# Patient Record
Sex: Male | Born: 2000 | Race: White | Hispanic: No | Marital: Single | Smoking: Former smoker
Health system: Southern US, Community
[De-identification: ages and names within clinical notes are randomized; demographics above are authoritative.]

## PROBLEM LIST (undated history)

## (undated) DIAGNOSIS — F988 Other specified behavioral and emotional disorders with onset usually occurring in childhood and adolescence: Secondary | ICD-10-CM

## (undated) DIAGNOSIS — F909 Attention-deficit hyperactivity disorder, unspecified type: Secondary | ICD-10-CM

## (undated) HISTORY — PX: NO PAST SURGERIES: SHX2092

## (undated) HISTORY — PX: FRACTURE SURGERY: SHX138

---
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
## 2020-03-17 ENCOUNTER — Other Ambulatory Visit: Payer: Self-pay

## 2020-03-17 ENCOUNTER — Ambulatory Visit (HOSPITAL_COMMUNITY)
Admission: EM | Admit: 2020-03-17 | Discharge: 2020-03-17 | Disposition: A | Discharge status: Home / Self Care | Payer: Medicaid Other | Attending: Family Medicine | Admitting: Family Medicine

## 2020-03-17 ENCOUNTER — Encounter (HOSPITAL_COMMUNITY): Payer: Self-pay

## 2020-03-17 DIAGNOSIS — Z20822 Contact with and (suspected) exposure to covid-19: Secondary | ICD-10-CM | POA: Insufficient documentation

## 2020-03-17 DIAGNOSIS — J029 Acute pharyngitis, unspecified: Secondary | ICD-10-CM | POA: Insufficient documentation

## 2020-03-17 LAB — POCT RAPID STREP A, ED / UC: Streptococcus, Group A Screen (Direct): NEGATIVE

## 2020-03-17 LAB — SARS CORONAVIRUS 2 (TAT 6-24 HRS): SARS Coronavirus 2: NEGATIVE

## 2020-03-17 NOTE — Discharge Instructions (Signed)
Negative rapid strep. We have sent your test to be cultured to confirm this result.  Self isolate until covid results are back and negative.  Will notify you by phone of any positive findings. Your negative results will be sent through your MyChart.     Throat lozenges, gargles, chloraseptic spray, warm teas, popsicles etc to help with throat pain.   If symptoms worsen or do not improve in the next week to return to be seen or to follow up with your PCP.

## 2020-03-17 NOTE — ED Provider Notes (Signed)
[facility]    CSN: [id number] Arrival date & time: 03/17/20  1030      History   Chief Complaint Chief Complaint  Patient presents with  . Sore Throat    HPI Randall Roth. is a 19 y.o. male.   Randall Roth. presents with complaints of sore throat, chills, headache. Headache comes and goes. Started two days ago. No known fevers. Some congestion. No cough. No rash. No gi symptoms. Was around his cousin Randall Roth for lunch on 8/8, who later tested positive for covid-19. Denies any previous similar, although did have strep frequently as a child. Feels somewhat similar. Has gotten his covid vaccine series.    ROS per HPI, negative if not otherwise mentioned.      Past Medical History:  Diagnosis Date  . ADD (attention deficit disorder)     There are no problems to display for this patient.   Past Surgical History:  Procedure Laterality Date  . NO PAST SURGERIES         Home Medications    Prior to Admission medications   Medication Sig Start Date End Date Taking? Authorizing Provider  amphetamine-dextroamphetamine (ADDERALL) 20 MG tablet Take 20 mg by mouth daily.  07/01/19  [provider]    Family History Family History  Problem Relation Age of Onset  . Healthy Mother   . Other Father        unknown medical history    Social History Social History   Tobacco Use  . Smoking status: Never Smoker  . Smokeless tobacco: Never Used  Vaping Use  . Vaping Use: Some days  . Substances: Nicotine, Flavoring  Substance Use Topics  . Alcohol use: No  . Drug use: No     Allergies   Patient has no known allergies.   Review of Systems Review of Systems   Physical Exam Triage Vital Signs ED Triage Vitals [03/17/20 1157]  Enc Vitals Group     BP 118/68     Pulse Rate 86     Resp 16     Temp 98.9 F (37.2 C)     Temp Source Oral     SpO2 97 %     Weight 145 lb (65.8 kg)     Height 5\' 11"  (1.803 m)     Head Circumference       Peak Flow      Pain Score 0     Pain Loc      Pain Edu?      Excl. in GC?    No data found.  Updated Vital Signs BP 118/68   Pulse 86   Temp 98.9 F (37.2 C) (Oral)   Resp 16   Ht 5\' 11"  (1.803 m)   Wt 145 lb (65.8 kg)   SpO2 97%   BMI 20.22 kg/m    Physical Exam Constitutional:      Appearance: He is well-developed.  HENT:     Nose:     Comments: Mild exudate to left tonsil without significant edema     Mouth/Throat:     Tonsils: Tonsillar exudate present.  Cardiovascular:     Rate and Rhythm: Normal rate.  Pulmonary:     Effort: Pulmonary effort is normal.  Lymphadenopathy:     Cervical: No cervical adenopathy.  Skin:    General: Skin is warm and dry.  Neurological:     Mental Status: He is alert and oriented to person, place, and time.  UC Treatments / Results  Labs (all labs ordered are listed, but only abnormal results are displayed) Labs Reviewed  SARS CORONAVIRUS 2 (TAT 6-24 HRS)  CULTURE, GROUP A STREP [facility])  POCT RAPID STREP A, ED / UC    EKG   Radiology No results found.  Procedures Procedures (including critical care time)  Medications Ordered in UC Medications - No data to display  Initial Impression / Assessment and Plan / UC Course  I have reviewed the triage vital signs and the nursing notes.  Pertinent labs & imaging results that were available during my care of the patient were reviewed by me and considered in my medical decision making (see chart for details).     Non toxic. Benign physical exam.  Negative rapid strep. covid testing pending. Supportive cares recommended. Return precautions provided. Patient verbalized understanding and agreeable to plan.   Final Clinical Impressions(s) / UC Diagnoses   Final diagnoses:  Acute pharyngitis, unspecified etiology     Discharge Instructions     Negative rapid strep. We have sent your test to be cultured to confirm this result.  Self isolate until covid results are  back and negative.  Will notify you by phone of any positive findings. Your negative results will be sent through your MyChart.     Throat lozenges, gargles, chloraseptic spray, warm teas, popsicles etc to help with throat pain.   If symptoms worsen or do not improve in the next week to return to be seen or to follow up with your PCP.       ED Prescriptions    None     PDMP not reviewed this encounter.   Georgetta Haber, NP 03/17/20 1229

## 2020-03-17 NOTE — ED Triage Notes (Signed)
Pt c/o sore throat, HA, chills, dysphagiax2 days.

## 2020-03-19 LAB — CULTURE, GROUP A STREP (THRC)

## 2020-06-25 ENCOUNTER — Ambulatory Visit
Admission: EM | Admit: 2020-06-25 | Discharge: 2020-06-25 | Disposition: A | Discharge status: Home / Self Care | Payer: Medicaid Other | Attending: Emergency Medicine | Admitting: Emergency Medicine

## 2020-06-25 ENCOUNTER — Other Ambulatory Visit: Payer: Self-pay

## 2020-06-25 ENCOUNTER — Encounter: Payer: Self-pay | Admitting: Emergency Medicine

## 2020-06-25 DIAGNOSIS — Z20822 Contact with and (suspected) exposure to covid-19: Secondary | ICD-10-CM | POA: Diagnosis present

## 2020-06-25 DIAGNOSIS — K529 Noninfective gastroenteritis and colitis, unspecified: Secondary | ICD-10-CM | POA: Insufficient documentation

## 2020-06-25 LAB — RESP PANEL BY RT-PCR (FLU A&B, COVID) ARPGX2
Influenza A by PCR: NEGATIVE
Influenza B by PCR: NEGATIVE
SARS Coronavirus 2 by RT PCR: NEGATIVE

## 2020-06-25 MED ORDER — ONDANSETRON 8 MG PO TBDP: ORAL_TABLET | ORAL | 0 refills | Status: DC

## 2020-06-25 NOTE — Discharge Instructions (Addendum)
Push electrolyte containing fluids such as Pedialyte or Gatorade till your urine is clear.  Tylenol/ibuprofen combined 3-4 times a day as needed for headache.  Zofran 3 times a day as needed for nausea.  This may make you constipated.  May to go back to work tomorrow if Covid is negative.

## 2020-06-25 NOTE — ED Triage Notes (Addendum)
Patient states that he had vomiting and diarrhea on Wed.  Patient states that his symptoms have resolved but still has nausea.  Patient will need a work note

## 2020-06-25 NOTE — ED Provider Notes (Signed)
HPI  SUBJECTIVE:  Randall Roth. is a 19 y.o. male who presents with nausea, vomiting, and diarrhea starting 4 days ago after eating some questionable leftovers.  States symptoms started several hours later.  He reports abdominal pain before the vomiting and diarrhea, which resolves after vomiting or having diarrhea.  No fevers.  He states that the abdominal pain, vomiting and diarrhea is gone, he is tolerating p.o. now, but has persistent nausea.  He reports a headache but thinks that this is due to dehydration.  No body aches, sore throat, nasal congestion, loss of sense of smell or taste, cough, shortness of breath.  No change in urine output.  No known Covid exposure.  He got the second dose of the Covid vaccine in June.  He tried ibuprofen for his headache with improvement in symptoms.  Symptoms are worse when he tries to eat processed food.  Past medical history negative for diabetes, abdominal surgeries, HIV, immunocompromise.  States that he needs a note to return to work.  PMD: He plans to go to [facility].    Past Medical History:  Diagnosis Date  . ADD (attention deficit disorder)     Past Surgical History:  Procedure Laterality Date  . NO PAST SURGERIES      Family History  Problem Relation Age of Onset  . Healthy Mother   . Other Father        unknown medical history    Social History   Tobacco Use  . Smoking status: Never Smoker  . Smokeless tobacco: Never Used  Vaping Use  . Vaping Use: Some days  . Substances: Nicotine, Flavoring  Substance Use Topics  . Alcohol use: No  . Drug use: No    No current facility-administered medications for this encounter.  Current Outpatient Medications:  .  ondansetron (ZOFRAN ODT) 8 MG disintegrating tablet, 1/2- 1 tablet q 8 hr prn nausea, vomiting, Disp: 20 tablet, Rfl: 0  No Known Allergies   ROS  As noted in HPI.   Physical Exam  BP 124/80 (BP Location: Right Arm)   Pulse 75   Temp 98.1 F (36.7 C)  (Oral)   Resp 16   Ht 5\' 10"  (1.778 m)   Wt 65.8 kg   SpO2 100%   BMI 20.81 kg/m   Constitutional: Well developed, well nourished, no acute distress Eyes:  EOMI, conjunctiva normal bilaterally HENT: Normocephalic, atraumatic,mucus membranes moist Respiratory: Normal inspiratory effort clear bilaterally Cardiovascular: Normal rate regular rhythm no murmurs rubs or gallops.  Cap refill 2 seconds. GI: nondistended, soft, nontender.  Active bowel sounds.  No rebound, guarding. skin: No rash, skin intact Musculoskeletal: no deformities Neurologic: Alert & oriented x 3, no focal neuro deficits Psychiatric: Speech and behavior appropriate   ED Course   Medications - No data to display  Orders Placed This Encounter  Procedures  . Resp Panel by RT-PCR (Flu A&B, Covid) Nasopharyngeal Swab    Standing Status:   Standing    Number of Occurrences:   1    Order Specific Question:   Is this test for diagnosis or screening    Answer:   Diagnosis of ill patient    Order Specific Question:   Symptomatic for COVID-19 as defined by CDC    Answer:   Yes    Order Specific Question:   Date of Symptom Onset    Answer:   06/23/2020    Order Specific Question:   Hospitalized for COVID-19  Answer:   No    Order Specific Question:   Admitted to ICU for COVID-19    Answer:   No    Order Specific Question:   Previously tested for COVID-19    Answer:   No    Order Specific Question:   Resident in a congregate (group) care setting    Answer:   No    Order Specific Question:   Employed in healthcare setting    Answer:   No    Order Specific Question:   Has patient completed COVID vaccination(s) (2 doses of Pfizer/Moderna 1 dose of [employer])    Answer:   Yes  . Airborne precautions    Standing Status:   Standing    Number of Occurrences:   1    Results for orders placed or performed during the hospital encounter of 06/25/20 (from the past 24 hour(s))  Resp Panel by RT-PCR (Flu A&B,  Covid) Nasopharyngeal Swab     Status: None   Collection Time: 06/25/20  3:55 PM   Specimen: Nasopharyngeal Swab; Nasopharyngeal(NP) swabs in vial transport medium  Result Value Ref Range   SARS Coronavirus 2 by RT PCR NEGATIVE NEGATIVE   Influenza A by PCR NEGATIVE NEGATIVE   Influenza B by PCR NEGATIVE NEGATIVE   No results found.  ED Clinical Impression  1. Gastroenteritis   2. Encounter for laboratory testing for COVID-19 virus      ED Assessment/Plan  Patient with a gastroenteritis.  His abdomen is benign.  He appears slightly dehydrated, but his vitals are normal.  However will check Covid.  Will contact patient only if positive.  Will send home with Zofran.  Push electrolyte containing fluids.  Work note clearing him to go back to work tomorrow if [employer] is negative.  Flu, Covid negative.  Discussed labs, MDM, treatment plan, and plan for follow-up with patient.  patient agrees with plan.   Meds ordered this encounter  Medications  . ondansetron (ZOFRAN ODT) 8 MG disintegrating tablet    Sig: 1/2- 1 tablet q 8 hr prn nausea, vomiting    Dispense:  20 tablet    Refill:  0    *This clinic note was created using Scientist, clinical (histocompatibility and immunogenetics). Therefore, there may be occasional mistakes despite careful proofreading.   ?    Domenick Gong, MD 06/25/20 1756

## 2020-09-14 ENCOUNTER — Other Ambulatory Visit: Payer: Self-pay

## 2020-09-14 ENCOUNTER — Ambulatory Visit (INDEPENDENT_AMBULATORY_CARE_PROVIDER_SITE_OTHER): Payer: Medicaid Other

## 2020-09-14 ENCOUNTER — Ambulatory Visit
Admission: EM | Admit: 2020-09-14 | Discharge: 2020-09-14 | Disposition: A | Discharge status: Home / Self Care | Payer: Medicaid Other | Attending: Physician Assistant | Admitting: Physician Assistant

## 2020-09-14 DIAGNOSIS — S161XXA Strain of muscle, fascia and tendon at neck level, initial encounter: Secondary | ICD-10-CM

## 2020-09-14 DIAGNOSIS — M25511 Pain in right shoulder: Secondary | ICD-10-CM

## 2020-09-14 DIAGNOSIS — S39012A Strain of muscle, fascia and tendon of lower back, initial encounter: Secondary | ICD-10-CM

## 2020-09-14 DIAGNOSIS — M542 Cervicalgia: Secondary | ICD-10-CM

## 2020-09-14 DIAGNOSIS — M25562 Pain in left knee: Secondary | ICD-10-CM

## 2020-09-14 HISTORY — DX: Attention-deficit hyperactivity disorder, unspecified type: F90.9

## 2020-09-14 MED ORDER — CYCLOBENZAPRINE HCL 10 MG PO TABS: 10.0000 mg | ORAL_TABLET | Freq: Three times a day (TID) | ORAL | 0 refills | Status: AC | PRN

## 2020-09-14 MED ORDER — ACETAMINOPHEN 500 MG PO TABS
1000.0000 mg | ORAL_TABLET | Freq: Once | ORAL | Status: AC
  Administered 2020-09-14: 1000 mg via ORAL

## 2020-09-14 MED ORDER — NAPROXEN 500 MG PO TBEC: 500.0000 mg | DELAYED_RELEASE_TABLET | Freq: Two times a day (BID) | ORAL | 0 refills | Status: AC
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
## 2020-09-14 NOTE — Discharge Instructions (Addendum)
SPRAIN: Stressed avoiding painful activities . Reviewed RICE guidelines. Use medications as directed, including NSAIDs. If no NSAIDs have been prescribed for you today, you may take Aleve or Motrin over the counter. May use Tylenol in between doses of NSAIDs.  If no improvement in the next 1-2 weeks, f/u with PCP or return to our office for reexamination, and please feel free to call or return at any time for any questions or concerns you may have and we will be happy to help you!      BACK PAIN: Stressed avoiding painful activities . RICE (REST, ICE, COMPRESSION, ELEVATION) guidelines reviewed. May alternate ice and heat. Consider use of muscle rubs, Salonpas patches, etc. Use medications as directed including muscle relaxers if prescribed. Take anti-inflammatory medications as prescribed or OTC NSAIDs/Tylenol.  F/u with PCP in 7-10 days for reexamination, and please feel free to call or return to the urgent care at any time for any questions or concerns you may have and we will be happy to help you!   BACK PAIN RED FLAGS: If the back pain acutely worsens or there are any red flag symptoms such as numbness/tingling, leg weakness, saddle anesthesia, or loss of bowel/bladder control, go immediately to the ER. Follow up with [place] as scheduled or sooner if the pain does not begin to resolve or if it worsens before the follow up    NECK PAIN: Stressed avoiding painful activities. This can exacerbate your symptoms and make them worse.  May apply heat to the areas of pain for some relief. Use medications as directed. Be aware of which medications make you drowsy and do not drive or operate any kind of heavy machinery while using the medication (ie pain medications or muscle relaxers). F/U with PCP for reexamination or return sooner if condition worsens or does not begin to improve over the next few days.   NECK PAIN RED FLAGS: If symptoms get worse than they are right now, you should come back sooner for  re-evaluation. If you have increased numbness/ tingling or notice that the numbness/tingling is affecting the legs or saddle region, go to ER. If you ever lose continence go to ER.

## 2020-11-16 ENCOUNTER — Other Ambulatory Visit: Payer: Self-pay

## 2020-11-16 ENCOUNTER — Ambulatory Visit
Admission: EM | Admit: 2020-11-16 | Discharge: 2020-11-16 | Disposition: A | Discharge status: Home / Self Care | Payer: Medicaid Other | Attending: Emergency Medicine | Admitting: Emergency Medicine

## 2020-11-16 DIAGNOSIS — M542 Cervicalgia: Secondary | ICD-10-CM | POA: Diagnosis present

## 2020-11-16 DIAGNOSIS — B2790 Infectious mononucleosis, unspecified without complication: Secondary | ICD-10-CM | POA: Diagnosis present

## 2020-11-16 DIAGNOSIS — J029 Acute pharyngitis, unspecified: Secondary | ICD-10-CM | POA: Insufficient documentation

## 2020-11-16 DIAGNOSIS — Z20822 Contact with and (suspected) exposure to covid-19: Secondary | ICD-10-CM | POA: Diagnosis present

## 2020-11-16 LAB — MONONUCLEOSIS SCREEN: Mono Screen: POSITIVE — AB

## 2020-11-16 LAB — GROUP A STREP BY PCR: Group A Strep by PCR: NOT DETECTED

## 2020-11-16 MED ORDER — IBUPROFEN 600 MG PO TABS: 600.0000 mg | ORAL_TABLET | Freq: Four times a day (QID) | ORAL | 0 refills | Status: AC | PRN

## 2020-11-16 MED ORDER — DEXAMETHASONE SODIUM PHOSPHATE 10 MG/ML IJ SOLN
10.0000 mg | Freq: Once | INTRAMUSCULAR | Status: AC
  Administered 2020-11-16: 10 mg via INTRAMUSCULAR

## 2020-11-16 MED ORDER — TIZANIDINE HCL 4 MG PO TABS: 4.0000 mg | ORAL_TABLET | Freq: Three times a day (TID) | ORAL | 0 refills | Status: DC | PRN
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
## 2020-11-16 NOTE — Discharge Instructions (Addendum)
Your strep PCR was negative.  I am checking a mono.  I will call you only if it is positive.  Or you can get the results through MyChart.  If you do not hear from me by the end of the day, you can assume that it is negative.  If you have mono, you will need to abstain from contact sports or any cavities where you might get hit in the abdomen.  I am giving you some information on mono so that you can read more about it.   COVID test will be back in 6 to 24 hours   1000 milligram of Tylenol and 600 mg ibuprofen together 3-4 times a day as needed for pain.  Make sure you drink plenty of extra fluids.  Some people find salt water gargles and  Traditional Medicinal's "Throat Coat" tea helpful. Take 5 mL of liquid Benadryl and 5 mL of Maalox. Mix it together, and then hold it in your mouth for as long as you can and then swallow. You may do this 4 times a day.    Follow-up with [facility] if your neck continues to give you problems after taking the Tylenol ibuprofen and Zanaflex.  Go to www.goodrx.com to look up your medications. This will give you a list of where you can find your prescriptions at the most affordable prices. Or ask the pharmacist what the cash price is, or if they have any other discount programs available to help make your medication more affordable. This can be less expensive than what you would pay with insurance.
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
## 2020-11-19 ENCOUNTER — Other Ambulatory Visit: Payer: Self-pay

## 2020-11-19 ENCOUNTER — Emergency Department (HOSPITAL_COMMUNITY)
Admission: EM | Admit: 2020-11-19 | Discharge: 2020-11-19 | Disposition: A | Discharge status: Home / Self Care | Payer: Medicaid Other | Attending: Emergency Medicine | Admitting: Emergency Medicine

## 2020-11-19 DIAGNOSIS — J029 Acute pharyngitis, unspecified: Secondary | ICD-10-CM | POA: Diagnosis present

## 2020-11-19 DIAGNOSIS — R110 Nausea: Secondary | ICD-10-CM | POA: Diagnosis not present

## 2020-11-19 DIAGNOSIS — B2700 Gammaherpesviral mononucleosis without complication: Secondary | ICD-10-CM | POA: Diagnosis not present

## 2020-11-19 DIAGNOSIS — Z87891 Personal history of nicotine dependence: Secondary | ICD-10-CM | POA: Insufficient documentation

## 2020-11-19 DIAGNOSIS — R7401 Elevation of levels of liver transaminase levels: Secondary | ICD-10-CM

## 2020-11-19 LAB — CBC WITH DIFFERENTIAL/PLATELET
Abs Immature Granulocytes: 0.05 10*3/uL (ref 0.00–0.07)
Basophils Absolute: 0.1 10*3/uL (ref 0.0–0.1)
Basophils Relative: 1 %
Eosinophils Absolute: 0.2 10*3/uL (ref 0.0–0.5)
Eosinophils Relative: 1 %
HCT: 39.3 % (ref 39.0–52.0)
Hemoglobin: 12.9 g/dL — ABNORMAL LOW (ref 13.0–17.0)
Immature Granulocytes: 0 %
Lymphocytes Relative: 45 %
Lymphs Abs: 5.7 10*3/uL — ABNORMAL HIGH (ref 0.7–4.0)
MCH: 30.6 pg (ref 26.0–34.0)
MCHC: 32.8 g/dL (ref 30.0–36.0)
MCV: 93.3 fL (ref 80.0–100.0)
Monocytes Absolute: 1.2 10*3/uL — ABNORMAL HIGH (ref 0.1–1.0)
Monocytes Relative: 10 %
Neutro Abs: 5.4 10*3/uL (ref 1.7–7.7)
Neutrophils Relative %: 43 %
Platelets: 212 10*3/uL (ref 150–400)
RBC: 4.21 MIL/uL — ABNORMAL LOW (ref 4.22–5.81)
RDW: 13.2 % (ref 11.5–15.5)
WBC: 12.6 10*3/uL — ABNORMAL HIGH (ref 4.0–10.5)
nRBC: 0 % (ref 0.0–0.2)

## 2020-11-19 LAB — COMPREHENSIVE METABOLIC PANEL
ALT: 124 U/L — ABNORMAL HIGH (ref 0–44)
AST: 37 U/L (ref 15–41)
Albumin: 3.2 g/dL — ABNORMAL LOW (ref 3.5–5.0)
Alkaline Phosphatase: 62 U/L (ref 38–126)
Anion gap: 7 (ref 5–15)
BUN: 13 mg/dL (ref 6–20)
CO2: 25 mmol/L (ref 22–32)
Calcium: 8.5 mg/dL — ABNORMAL LOW (ref 8.9–10.3)
Chloride: 104 mmol/L (ref 98–111)
Creatinine, Ser: 0.8 mg/dL (ref 0.61–1.24)
GFR, Estimated: >60 mL/min (ref >60)
Glucose, Bld: 86 mg/dL (ref 70–99)
Potassium: 4.4 mmol/L (ref 3.5–5.1)
Sodium: 136 mmol/L (ref 135–145)
Total Bilirubin: 0.7 mg/dL (ref 0.3–1.2)
Total Protein: 5.9 g/dL — ABNORMAL LOW (ref 6.5–8.1)

## 2020-11-19 MED ORDER — ONDANSETRON HCL 4 MG PO TABS: 4.0000 mg | ORAL_TABLET | Freq: Four times a day (QID) | ORAL | 0 refills | Status: AC | PRN

## 2020-11-19 MED ORDER — METHYLPREDNISOLONE SODIUM SUCC 125 MG IJ SOLR
125.0000 mg | Freq: Once | INTRAMUSCULAR | Status: AC
  Administered 2020-11-19: 125 mg via INTRAVENOUS
  Filled 2020-11-19: qty 2

## 2020-11-19 MED ORDER — LACTATED RINGERS IV BOLUS
1000.0000 mL | Freq: Once | INTRAVENOUS | Status: AC
  Administered 2020-11-19: 1000 mL via INTRAVENOUS

## 2020-11-19 MED ORDER — PREDNISONE 50 MG PO TABS: 50.0000 mg | ORAL_TABLET | Freq: Every day | ORAL | 0 refills | Status: DC

## 2020-11-19 MED ORDER — ONDANSETRON HCL 4 MG/2ML IJ SOLN
4.0000 mg | Freq: Once | INTRAMUSCULAR | Status: AC
  Administered 2020-11-19: 4 mg via INTRAVENOUS
  Filled 2020-11-19: qty 2

## 2020-11-19 NOTE — ED Provider Notes (Signed)
[facility] EMERGENCY DEPARTMENT Provider Note   CSN: [id number] Arrival date & time: 11/19/20  0421   History Chief Complaint  Patient presents with  . Sore Throat    Randall Roth. is a 20 y.o. male.  The history is provided by the patient.  Sore Throat  He comes in complaining of ongoing sore throat and pain in his neck.  He started having a sore throat about 4 days ago and was seen at urgent care 2 days ago where he was diagnosed with mono.  He was given a dose of dexamethasone.  He felt better the next day, but over the last 24 hours, he has had increased swelling in his neck and pain in his neck.  There has been some nausea but no vomiting.  He denies fever or chills.  He denies weakness.  Past Medical History:  Diagnosis Date  . ADD (attention deficit disorder)   . ADHD     There are no problems to display for this patient.   Past Surgical History:  Procedure Laterality Date  . NO PAST SURGERIES         Family History  Problem Relation Age of Onset  . Healthy Mother   . Other Father        unknown medical history    Social History   Tobacco Use  . Smoking status: Former Games developer  . Smokeless tobacco: Never Used  Vaping Use  . Vaping Use: Some days  . Substances: Nicotine, Flavoring  Substance Use Topics  . Alcohol use: No    Comment: social  . Drug use: No    Home Medications Prior to Admission medications   Medication Sig Start Date End Date Taking? Authorizing Provider  amphetamine-dextroamphetamine (ADDERALL XR) 20 MG 24 hr capsule Take 1 capsule by mouth every morning. 10/18/20 11/17/20  [provider]  ibuprofen (ADVIL) 600 MG tablet Take 1 tablet (600 mg total) by mouth every 6 (six) hours as needed. 11/16/20   Domenick Gong, MD  tiZANidine (ZANAFLEX) 4 MG tablet Take 1 tablet (4 mg total) by mouth every 8 (eight) hours as needed for muscle spasms. 11/16/20   Domenick Gong, MD    Allergies    Patient has no  known allergies.  Review of Systems   Review of Systems  All other systems reviewed and are negative.   Physical Exam Updated Vital Signs BP (!) 131/93 (BP Location: Left Arm)   Pulse 65   Temp (!) 97.3 F (36.3 C) (Oral)   Resp 18   Ht 5\' 11"  (1.803 m)   Wt 65.8 kg   SpO2 100%   BMI 20.23 kg/m   Physical Exam Vitals and nursing note reviewed.   20 year old male, resting comfortably and in no acute distress. Vital signs are significant for borderline elevated blood pressure. Oxygen saturation is 100%, which is normal. Head is normocephalic and atraumatic. PERRLA, EOMI. Oropharynx is shows moderate tonsillar hypertrophy with faint exudate.  There is no pooling of secretions, phonation is normal. Neck is nontender and supple.  There is moderate bilateral anterior and posterior cervical adenopathy. Back is nontender and there is no CVA tenderness. Lungs are clear without rales, wheezes, or rhonchi. Chest is nontender. Heart has regular rate and rhythm without murmur. Abdomen is soft, flat, nontender without masses or hepatosplenomegaly and peristalsis is normoactive. Extremities have no cyanosis or edema, full range of motion is present. Skin is warm and dry without rash. Neurologic: Mental  status is normal, cranial nerves are intact, there are no motor or sensory deficits.  ED Results / Procedures / Treatments   Labs (all labs ordered are listed, but only abnormal results are displayed) Labs Reviewed  COMPREHENSIVE METABOLIC PANEL - Abnormal; Notable for the following components:      Result Value   Calcium 8.5 (*)    Total Protein 5.9 (*)    Albumin 3.2 (*)    ALT 124 (*)    All other components within normal limits  CBC WITH DIFFERENTIAL/PLATELET - Abnormal; Notable for the following components:   WBC 12.6 (*)    RBC 4.21 (*)    Hemoglobin 12.9 (*)    All other components within normal limits   Procedures Procedures   Medications Ordered in ED Medications   lactated ringers bolus 1,000 mL (has no administration in time range)  ondansetron (ZOFRAN) injection 4 mg (has no administration in time range)  methylPREDNISolone sodium succinate (SOLU-MEDROL) 125 mg/2 mL injection 125 mg (has no administration in time range)    ED Course  I have reviewed the triage vital signs and the nursing notes.  Pertinent lab results that were available during my care of the patient were reviewed by me and considered in my medical decision making (see chart for details).  MDM Rules/Calculators/A&P Infectious mononucleosis.  Old records are reviewed confirming urgent care visit 2 days ago with positive monoscreen and treatment with single dose dexamethasone.  He will be given IV fluids, methylprednisolone, ondansetron.  Will check screening labs as well.  Labs show mild elevation of ALT, borderline anemia.  He feels significantly improved after above-noted treatment.  He is discharged with prescriptions for prednisone, ondansetron.  Final Clinical Impression(s) / ED Diagnoses Final diagnoses:  Gammaherpesviral mononucleosis without complication  Elevated ALT measurement  Nausea    Rx / DC Orders ED Discharge Orders         Ordered    ondansetron (ZOFRAN) 4 MG tablet  Every 6 hours PRN        11/19/20 0723    predniSONE (DELTASONE) 50 MG tablet  Daily        11/19/20 0724           Dione Booze, MD 11/19/20 [phone]

## 2020-11-19 NOTE — ED Triage Notes (Signed)
Pt presents to the ED with sore throat no fevers since Sunday. Pt states he tested positive for Mono and given a one time dose steroid and Ibuprofen, but patient states he is not feeling any better. Patent airway. Voice is hoarse.

## 2020-11-22 ENCOUNTER — Emergency Department (HOSPITAL_COMMUNITY)
Admission: EM | Admit: 2020-11-22 | Discharge: 2020-11-22 | Disposition: A | Discharge status: Home / Self Care | Payer: Medicaid Other | Attending: Emergency Medicine | Admitting: Emergency Medicine

## 2020-11-22 ENCOUNTER — Encounter (HOSPITAL_COMMUNITY): Payer: Self-pay

## 2020-11-22 ENCOUNTER — Other Ambulatory Visit: Payer: Self-pay

## 2020-11-22 DIAGNOSIS — R042 Hemoptysis: Secondary | ICD-10-CM | POA: Diagnosis present

## 2020-11-22 DIAGNOSIS — Z87891 Personal history of nicotine dependence: Secondary | ICD-10-CM | POA: Insufficient documentation

## 2020-11-22 LAB — COMPREHENSIVE METABOLIC PANEL
ALT: 160 U/L — ABNORMAL HIGH (ref 0–44)
AST: 42 U/L — ABNORMAL HIGH (ref 15–41)
Albumin: 3 g/dL — ABNORMAL LOW (ref 3.5–5.0)
Alkaline Phosphatase: 53 U/L (ref 38–126)
Anion gap: 4 — ABNORMAL LOW (ref 5–15)
BUN: 12 mg/dL (ref 6–20)
CO2: 30 mmol/L (ref 22–32)
Calcium: 8.6 mg/dL — ABNORMAL LOW (ref 8.9–10.3)
Chloride: 106 mmol/L (ref 98–111)
Creatinine, Ser: 0.85 mg/dL (ref 0.61–1.24)
GFR, Estimated: >60 mL/min (ref >60)
Glucose, Bld: 84 mg/dL (ref 70–99)
Potassium: 4.1 mmol/L (ref 3.5–5.1)
Sodium: 140 mmol/L (ref 135–145)
Total Bilirubin: 0.8 mg/dL (ref 0.3–1.2)
Total Protein: 5.8 g/dL — ABNORMAL LOW (ref 6.5–8.1)

## 2020-11-22 LAB — CBC WITH DIFFERENTIAL/PLATELET
Abs Immature Granulocytes: 0.06 10*3/uL (ref 0.00–0.07)
Basophils Absolute: 0.1 10*3/uL (ref 0.0–0.1)
Basophils Relative: 1 %
Eosinophils Absolute: 0.1 10*3/uL (ref 0.0–0.5)
Eosinophils Relative: 1 %
HCT: 40.9 % (ref 39.0–52.0)
Hemoglobin: 13.1 g/dL (ref 13.0–17.0)
Immature Granulocytes: 1 %
Lymphocytes Relative: 40 %
Lymphs Abs: 4.4 10*3/uL — ABNORMAL HIGH (ref 0.7–4.0)
MCH: 30.7 pg (ref 26.0–34.0)
MCHC: 32 g/dL (ref 30.0–36.0)
MCV: 95.8 fL (ref 80.0–100.0)
Monocytes Absolute: 1.1 10*3/uL — ABNORMAL HIGH (ref 0.1–1.0)
Monocytes Relative: 10 %
Neutro Abs: 5.2 10*3/uL (ref 1.7–7.7)
Neutrophils Relative %: 47 %
Platelets: 249 10*3/uL (ref 150–400)
RBC: 4.27 MIL/uL (ref 4.22–5.81)
RDW: 13.1 % (ref 11.5–15.5)
WBC: 10.9 10*3/uL — ABNORMAL HIGH (ref 4.0–10.5)
nRBC: 0 % (ref 0.0–0.2)

## 2020-11-22 LAB — PROTIME-INR
INR: 1 (ref 0.8–1.2)
Prothrombin Time: 12.9 seconds (ref 11.4–15.2)

## 2020-11-22 LAB — APTT: aPTT: 29 seconds (ref 24–36)

## 2020-11-22 NOTE — ED Notes (Signed)
Patient Alert and oriented to baseline. Stable and ambulatory to baseline. Patient verbalized understanding of the discharge instructions.  Patient belongings were taken by the patient.   

## 2020-11-22 NOTE — Discharge Instructions (Addendum)
Lab work and exam look reassuring.  I suspect the blood is coming from your right tonsil.  Please abstain from coughing, and eat a soft/liquid diet to help decrease inflammation around your tonsils.  I would like you to follow-up with your primary care doctor and/or ENT this week for reevaluation.  Please come back to emergency department if you have bleeding that is uncontrolled from your throat, you develop chest pain, shortness of breath, feeling lightheaded, fatigue, become pale as these symptoms concerning for possible anemia, also come back if you have worsening swelling of your neck, tonsils, tongue or difficulty swallowing own saliva.

## 2020-11-22 NOTE — ED Triage Notes (Signed)
Pt reports he was dx with mono on 11/19/20. Pt reports he is here today due to spitting up blood since 4am. Pt reports he think it is related to his sinuses bc he feels the blood is not coming from his throat. Pt reports he feels better but was concern for the blood.

## 2020-11-22 NOTE — ED Provider Notes (Signed)
[facility] EMERGENCY DEPARTMENT Provider Note   CSN: [id number] Arrival date & time: 11/22/20  1287     History Chief Complaint  Patient presents with  . Hematemesis    Randall Roth. is a 20 y.o. male.  HPI   Patient with significant medical history of ADHD presents with chief complaint of coughing up blood.  He states that he was recently diagnosed with mono has received steroids and felt like he is getting better.  But this morning he started to cough up some mucus and noted blood was in it.  He states that he only sees blood when he coughs, he is unsure if this is coming from his nose or his tonsils.  He has no bleeding disorder, is not on anticoagulant, he denies abnormal bruising or nosebleeds.  He denies alcohol dependency, denies any right upper quadrant pain.  Patient states he is here today because he is concerned of the blood in his throat.  He has no associated chest pain, shortness of breath, feeling fatigued, lightheadedness or dizziness.  Patient denies  alleviating factors.  Patient denies headaches, fevers, chills, shortness of breath, chest pain, abdominal pain, nausea, vomit, diarrhea, worsening pedal edema.  Past Medical History:  Diagnosis Date  . ADD (attention deficit disorder)   . ADHD     There are no problems to display for this patient.   Past Surgical History:  Procedure Laterality Date  . NO PAST SURGERIES         Family History  Problem Relation Age of Onset  . Healthy Mother   . Other Father        unknown medical history    Social History   Tobacco Use  . Smoking status: Former Games developer  . Smokeless tobacco: Never Used  Vaping Use  . Vaping Use: Some days  . Substances: Nicotine, Flavoring  Substance Use Topics  . Alcohol use: No    Comment: social  . Drug use: No    Home Medications Prior to Admission medications   Medication Sig Start Date End Date Taking? Authorizing Provider  amphetamine-dextroamphetamine  (ADDERALL XR) 20 MG 24 hr capsule Take 1 capsule by mouth every morning. 10/18/20 11/17/20  [provider]  ibuprofen (ADVIL) 600 MG tablet Take 1 tablet (600 mg total) by mouth every 6 (six) hours as needed. 11/16/20   Domenick Gong, MD  ondansetron (ZOFRAN) 4 MG tablet Take 1 tablet (4 mg total) by mouth every 6 (six) hours as needed for nausea. 11/19/20   Dione Booze, MD  predniSONE (DELTASONE) 50 MG tablet Take 1 tablet (50 mg total) by mouth daily. 11/19/20   Dione Booze, MD  tiZANidine (ZANAFLEX) 4 MG tablet Take 1 tablet (4 mg total) by mouth every 8 (eight) hours as needed for muscle spasms. 11/16/20   Domenick Gong, MD    Allergies    Patient has no known allergies.  Review of Systems   Review of Systems  Constitutional: Negative for chills and fever.  HENT: Negative for congestion, ear pain and sore throat.        Coughing up blood  Eyes: Negative for visual disturbance.  Respiratory: Negative for shortness of breath.   Cardiovascular: Negative for chest pain.  Gastrointestinal: Negative for abdominal pain, diarrhea, nausea and vomiting.  Genitourinary: Negative for enuresis.  Musculoskeletal: Negative for back pain.  Skin: Negative for rash.  Neurological: Negative for headaches.  Hematological: Does not bruise/bleed easily.    Physical Exam Updated Vital  Signs BP 129/83   Pulse 68   Temp 97.7 F (36.5 C)   Resp 15   SpO2 99%   Physical Exam Vitals and nursing note reviewed.  Constitutional:      General: He is not in acute distress.    Appearance: He is not ill-appearing.  HENT:     Head: Normocephalic and atraumatic.     Nose: No congestion.     Mouth/Throat:     Mouth: Mucous membranes are moist.     Pharynx: Oropharynx is clear. Posterior oropharyngeal erythema present. No oropharyngeal exudate.     Comments: Patient's oropharynx was visualized tongue and uvula are both midline, patient has noted erythema tonsils on the right side, there is  noted bleeding from the area, patient is controlling oral secretions, there is no noted swelling underneath the tongue.  Negative torticollis or trismus. Eyes:     Conjunctiva/sclera: Conjunctivae normal.  Cardiovascular:     Rate and Rhythm: Normal rate and regular rhythm.     Pulses: Normal pulses.     Heart sounds: No murmur heard. No friction rub. No gallop.   Pulmonary:     Effort: No respiratory distress.     Breath sounds: No wheezing, rhonchi or rales.  Abdominal:     Palpations: Abdomen is soft.     Tenderness: There is no abdominal tenderness.  Musculoskeletal:     Right lower leg: No edema.     Left lower leg: No edema.  Skin:    General: Skin is warm and dry.  Neurological:     Mental Status: He is alert.  Psychiatric:        Mood and Affect: Mood normal.     ED Results / Procedures / Treatments   Labs (all labs ordered are listed, but only abnormal results are displayed) Labs Reviewed  COMPREHENSIVE METABOLIC PANEL - Abnormal; Notable for the following components:      Result Value   Calcium 8.6 (*)    Total Protein 5.8 (*)    Albumin 3.0 (*)    AST 42 (*)    ALT 160 (*)    Anion gap 4 (*)    All other components within normal limits  CBC WITH DIFFERENTIAL/PLATELET - Abnormal; Notable for the following components:   WBC 10.9 (*)    All other components within normal limits  PROTIME-INR  APTT    EKG None  Radiology No results found.  Procedures Procedures   Medications Ordered in ED Medications - No data to display  ED Course  I have reviewed the triage vital signs and the nursing notes.  Pertinent labs & imaging results that were available during my care of the patient were reviewed by me and considered in my medical decision making (see chart for details).    MDM Rules/Calculators/A&P                         Initial impression-patient presents with concerns of coughing up blood.  He is alert, does not appear in acute distress, vital  signs reassuring.  Concern for possible coagulopathy secondary due to mono, will obtain basic lab works and reassess.  Work-up-CBC shows Mutaz of 10.9, no signs of anemia.  Prothrombin time 12.9, INR 1, a PTT 29, CMP shows slightly elevated AST and ALT, AST 42, ALT 160   Rule out- I have low suspicion for peritonsillar abscess, retropharyngeal abscess, or Ludwig angina as oropharynx was visualized tongue and uvula  were both midline, no torticollis, patient controlling oral secretions.  Low suspicion for coagulopathy as prothrombin time is 12.9, INR 1, APTT 29. low Suspicion for anemia as hemoglobin is 13..1, patient denies anemia symptoms. low Suspicion for cirrhosis as liver enzymes at baseline from 4 days prior, no right upper quadrant pain..  Plan-  1.  Coughing up blood suspect is secondary due to mono [place] patient dislodged a clot on his tonsils causing bleeding.  We will have him follow-up with ENT and and/or primary care doctor in 1 week's time for reevaluation.  Vital signs have remained stable, no indication for hospital admission.  Patient discussed with attending and they agreed with assessment and plan.  Patient given at home care as well strict return precautions.  Patient verbalized that they understood agreed to said plan.   Final Clinical Impression(s) / ED Diagnoses Final diagnoses:  Hemoptysis    Rx / DC Orders ED Discharge Orders    None       Carroll Sage, PA-C 11/22/20 4360    Benjiman Core, MD 11/22/20 [phone]

## 2021-01-08 ENCOUNTER — Ambulatory Visit
Admission: EM | Admit: 2021-01-08 | Discharge: 2021-01-08 | Disposition: A | Discharge status: Home / Self Care | Payer: Medicaid Other

## 2021-01-08 ENCOUNTER — Other Ambulatory Visit: Payer: Self-pay

## 2021-01-08 ENCOUNTER — Encounter: Payer: Self-pay | Admitting: Emergency Medicine

## 2021-01-08 DIAGNOSIS — S72412S Displaced unspecified condyle fracture of lower end of left femur, sequela: Secondary | ICD-10-CM

## 2021-01-08 MED ORDER — OXYCODONE HCL 5 MG PO TABS: 5.0000 mg | ORAL_TABLET | ORAL | 0 refills | Status: AC | PRN

## 2021-01-08 NOTE — Discharge Instructions (Addendum)
Call your orthopedic doctor Monday morning early so they can give you a refill if they want you to continue the Oxycodone until your next appointment with them Try stretching out the pain medication to every 6 hours, and you may take 1000 mg of Tylenol between doses.
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
## 2022-09-14 ENCOUNTER — Ambulatory Visit
Admission: EM | Admit: 2022-09-14 | Discharge: 2022-09-14 | Disposition: A | Discharge status: Home / Self Care | Payer: Medicaid Other | Attending: Emergency Medicine | Admitting: Emergency Medicine

## 2022-09-14 ENCOUNTER — Other Ambulatory Visit: Payer: Self-pay

## 2022-09-14 DIAGNOSIS — J029 Acute pharyngitis, unspecified: Secondary | ICD-10-CM | POA: Insufficient documentation

## 2022-09-14 LAB — RAPID INFLUENZA A&B ANTIGENS
Influenza A (ARMC): NEGATIVE
Influenza B (ARMC): NEGATIVE

## 2022-09-14 LAB — GROUP A STREP BY PCR: Group A Strep by PCR: NOT DETECTED

## 2022-09-14 NOTE — ED Provider Notes (Signed)
[facility]    CSN: [id number] Arrival date & time: 09/14/22  1035      History   Chief Complaint Chief Complaint  Patient presents with   Sore scratchy throat    HPI Randall Roth. is a 22 y.o. male.   HPI  22 year old male here for evaluation of sore throat.  The patient reports that his symptoms began 2 days ago and they are also associated with fatigue, nausea, nasal congestion, and a nonproductive cough.  He denies fever, ear pain, shortness of breath or wheezing, abdominal pain, or swollen glands.  He denies any known sick contacts.  Past Medical History:  Diagnosis Date   ADD (attention deficit disorder)    ADHD     There are no problems to display for this patient.   Past Surgical History:  Procedure Laterality Date   FRACTURE SURGERY     NO PAST SURGERIES         Home Medications    Prior to Admission medications   Medication Sig Start Date End Date Taking? Authorizing Provider  amphetamine-dextroamphetamine (ADDERALL XR) 20 MG 24 hr capsule Take 1 capsule by mouth every morning. 10/18/20 01/08/21  [provider]  CLEARLAX 17 GM/SCOOP powder Take 17 g by mouth daily. 01/05/21   [provider]  enoxaparin (LOVENOX) 40 MG/0.4ML injection Inject into the skin. 01/04/21 02/03/21  [provider]  gabapentin (NEURONTIN) 300 MG capsule Take 1 capsule by mouth 3 (three) times daily. 01/05/21   [provider]  ibuprofen (ADVIL) 600 MG tablet Take 1 tablet (600 mg total) by mouth every 6 (six) hours as needed. 11/16/20   Melynda Ripple, MD  methocarbamol (ROBAXIN) 500 MG tablet Take 1 tablet by mouth 3 (three) times daily. 01/05/21   [provider]  ondansetron (ZOFRAN) 4 MG tablet Take 1 tablet (4 mg total) by mouth every 6 (six) hours as needed for nausea. [id number]   Delora Fuel, MD  oxyCODONE (ROXICODONE) 5 MG immediate release tablet Take 1 tablet (5 mg total) by mouth every 4 (four) hours as needed for  severe pain. 01/09/21   Rodriguez-Southworth, Sunday Spillers, PA-C  Vitamin D, Ergocalciferol, (DRISDOL) 1.25 MG (50000 UNIT) CAPS capsule Take 1 capsule by mouth once a week. 01/05/21   [provider]    Family History Family History  Problem Relation Age of Onset   Healthy Mother    Other Father        unknown medical history    Social History Social History   Tobacco Use   Smoking status: Former   Smokeless tobacco: Never  Scientific laboratory technician Use: Some days   Substances: Nicotine, Flavoring  Substance Use Topics   Alcohol use: No    Comment: social   Drug use: No     Allergies   Patient has no known allergies.   Review of Systems Review of Systems  Constitutional:  Positive for fatigue. Negative for fever.  HENT:  Positive for congestion and sore throat. Negative for ear pain and rhinorrhea.   Respiratory:  Positive for cough. Negative for shortness of breath and wheezing.   Gastrointestinal:  Negative for abdominal pain.  Hematological:  Negative for adenopathy.     Physical Exam Triage Vital Signs ED Triage Vitals  Enc Vitals Group     BP 09/14/22 1104 136/80     Pulse Rate 09/14/22 1104 99     Resp --      Temp 09/14/22 1104 98.6  F (37 C)     Temp src --      SpO2 --      Weight --      Height --      Head Circumference --      Peak Flow --      Pain Score 09/14/22 1105 0     Pain Loc --      Pain Edu? --      Excl. in [place]? --    No data found.  Updated Vital Signs BP 136/80   Pulse 99   Temp 98.6 F (37 C)   Visual Acuity Right Eye Distance:   Left Eye Distance:   Bilateral Distance:    Right Eye Near:   Left Eye Near:    Bilateral Near:     Physical Exam Vitals and nursing note reviewed.  Constitutional:      Appearance: Normal appearance. He is not ill-appearing.  HENT:     Head: Normocephalic and atraumatic.     Right Ear: Tympanic membrane, ear canal and external ear normal. There is no impacted cerumen.     Left Ear:  Tympanic membrane, ear canal and external ear normal. There is no impacted cerumen.     Nose: Congestion and rhinorrhea present.     Comments: Nasal mucosa is erythematous and edematous with scant clear discharge in both nares.    Mouth/Throat:     Mouth: Mucous membranes are moist.     Pharynx: Oropharynx is clear. Posterior oropharyngeal erythema present. No oropharyngeal exudate.     Comments: Tonsillar pillars and soft palate are erythematous with 2+ edema to the tonsillar pillars.  No appreciable exudate.  Posterior oropharynx does have erythema with clear postnasal drip. Cardiovascular:     Rate and Rhythm: Normal rate and regular rhythm.     Pulses: Normal pulses.     Heart sounds: Normal heart sounds. No murmur heard.    No friction rub. No gallop.  Pulmonary:     Effort: Pulmonary effort is normal.     Breath sounds: Normal breath sounds. No wheezing, rhonchi or rales.  Musculoskeletal:     Cervical back: Normal range of motion and neck supple.  Lymphadenopathy:     Cervical: No cervical adenopathy.  Skin:    General: Skin is warm and dry.     Capillary Refill: Capillary refill takes less than 2 seconds.     Findings: No erythema or rash.  Neurological:     General: No focal deficit present.     Mental Status: He is alert and oriented to person, place, and time.  Psychiatric:        Mood and Affect: Mood normal.        Behavior: Behavior normal.        Thought Content: Thought content normal.        Judgment: Judgment normal.      UC Treatments / Results  Labs (all labs ordered are listed, but only abnormal results are displayed) Labs Reviewed  RAPID INFLUENZA A&B ANTIGENS  GROUP A STREP BY PCR    EKG   Radiology No results found.  Procedures Procedures (including critical care time)  Medications Ordered in UC Medications - No data to display  Initial Impression / Assessment and Plan / UC Course  I have reviewed the triage vital signs and the nursing  notes.  Pertinent labs & imaging results that were available during my care of the patient were reviewed by me and  considered in my medical decision making (see chart for details).   Patient is a pleasant, nontoxic-appearing 22 year old male here for evaluation of nasal congestion, nonproductive cough, and sore throat with associated nausea and fatigue that began 2 days ago.  No measured fever at home.  The patient denies any acute distress but he does have inflamed nasal mucosa as well as erythematous edematous tonsillar pillars.  I will order a strep PCR and flu antigen test as he has symptoms of an upper respiratory infection on exam.  Influenza antigen test is negative for A and B.  PCR is pending.  I will discharge patient home with diagnosis of pharyngitis and have him use over-the-counter Chloraseptic and Sucrets lozenges along with Tylenol, ibuprofen, salt water gargles to help soothe his pain.  If he does positive for strep I will start him on antibiotics.   Final Clinical Impressions(s) / UC Diagnoses   Final diagnoses:  Acute pharyngitis, unspecified etiology     Discharge Instructions      Your strep test is currently pending and we should have an answer later this afternoon.  If your test is positive then we will call you.  If your test is negative it will appear in your MyChart.  If you do not hear from [place] your sore throat is most likely viral in nature.  Gargle with warm salt water 2-3 times a day to soothe your throat, aid in pain relief, and aid in healing.  Take over-the-counter Tylenol and/or ibuprofen according to the package instructions as needed for pain.  You can also use Chloraseptic or Sucrets lozenges, 1 lozenge every 2 hours as needed for throat pain.  If you develop any new or worsening symptoms return for reevaluation.      ED Prescriptions   None    PDMP not reviewed this encounter.   Margarette Canada, NP 09/14/22 1220

## 2022-09-14 NOTE — ED Triage Notes (Signed)
Pt c/o weakness and nausea since Tuesday

## 2022-09-14 NOTE — Discharge Instructions (Addendum)
Your strep test is currently pending and we should have an answer later this afternoon.  If your test is positive then we will call you.  If your test is negative it will appear in your MyChart.  If you do not hear from [place] your sore throat is most likely viral in nature.  Gargle with warm salt water 2-3 times a day to soothe your throat, aid in pain relief, and aid in healing.  Take over-the-counter Tylenol and/or ibuprofen according to the package instructions as needed for pain.  You can also use Chloraseptic or Sucrets lozenges, 1 lozenge every 2 hours as needed for throat pain.  If you develop any new or worsening symptoms return for reevaluation.

## 2023-04-23 ENCOUNTER — Ambulatory Visit
Admission: EM | Admit: 2023-04-23 | Discharge: 2023-04-23 | Disposition: A | Discharge status: Home / Self Care | Payer: Self-pay | Attending: Internal Medicine | Admitting: Internal Medicine

## 2023-04-23 ENCOUNTER — Ambulatory Visit (INDEPENDENT_AMBULATORY_CARE_PROVIDER_SITE_OTHER): Payer: Self-pay

## 2023-04-23 DIAGNOSIS — S7011XA Contusion of right thigh, initial encounter: Secondary | ICD-10-CM

## 2023-04-23 NOTE — Discharge Instructions (Addendum)
Independent review of the x-ray does not appear to show any gross abnormalities however the radiologist will make the final read.  Likely this is a hematoma of the muscle.  Treatment includes compression therapy, rest and ice.  Patient can use Tylenol for discomfort.  Avoid heavy activity for the next 2 to 3 days.  Return to urgent care if symptoms worsen or fail to resolve
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
## 2023-04-23 NOTE — ED Triage Notes (Signed)
Pt c/o R leg pain since 12 pm today. States log fell on leg.

## 2023-09-17 ENCOUNTER — Ambulatory Visit
Admission: EM | Admit: 2023-09-17 | Discharge: 2023-09-17 | Disposition: A | Discharge status: Home / Self Care | Payer: Self-pay | Attending: Family Medicine | Admitting: Family Medicine

## 2023-09-17 DIAGNOSIS — R3129 Other microscopic hematuria: Secondary | ICD-10-CM | POA: Insufficient documentation

## 2023-09-17 DIAGNOSIS — R1031 Right lower quadrant pain: Secondary | ICD-10-CM | POA: Insufficient documentation

## 2023-09-17 DIAGNOSIS — R82998 Other abnormal findings in urine: Secondary | ICD-10-CM | POA: Insufficient documentation

## 2023-09-17 LAB — URINALYSIS, W/ REFLEX TO CULTURE (INFECTION SUSPECTED)
Bacteria, UA: NONE SEEN
Bilirubin Urine: NEGATIVE
Glucose, UA: NEGATIVE mg/dL
Ketones, ur: NEGATIVE mg/dL
Leukocytes,Ua: NEGATIVE
Nitrite: NEGATIVE
Protein, ur: 30 mg/dL — AB
RBC / HPF: >50 RBC/hpf (ref 0–5)
Specific Gravity, Urine: 1.025 (ref 1.005–1.030)
Squamous Epithelial / HPF: NONE SEEN /[HPF] (ref 0–5)
pH: 5.5 (ref 5.0–8.0)

## 2023-09-17 MED ORDER — KETOROLAC TROMETHAMINE 30 MG/ML IJ SOLN
30.0000 mg | Freq: Once | INTRAMUSCULAR | Status: AC
  Administered 2023-09-17: 30 mg via INTRAMUSCULAR

## 2023-09-17 NOTE — ED Provider Notes (Signed)
 [facility]    CSN: [id number] Arrival date & time: 09/17/23  0816      History   Chief Complaint Chief Complaint  Patient presents with   Abdominal Pain   Emesis   Dysuria    HPI Randall Roth. is a 23 y.o. male.   HPI  Randall Roth presents for intermittent sharp-pressure like pain RLQ abdominal pain that started yesterday morning and is getting worse.  Took tylenol this morning. Had an episode vomiting this morning.  No diarrhea. Pain radiates to his pelvis.  Pain 9/10 currently. Has dysuria but no hematuria.  No history of kidney stones     Past Surgeries: no previous abdominal surgeries   Symptoms Nausea/Vomiting: yes  Diarrhea: no  Constipation: no  Melena/BRBPR: no  Hematemesis: no  Anorexia: no  Fever/Chills: no  Dysuria: yes    Past Medical History:  Diagnosis Date   ADD (attention deficit disorder)    ADHD     There are no active problems to display for this patient.   Past Surgical History:  Procedure Laterality Date   FRACTURE SURGERY     NO PAST SURGERIES         Home Medications    Prior to Admission medications   Medication Sig Start Date End Date Taking? Authorizing Provider  amphetamine-dextroamphetamine (ADDERALL XR) 20 MG 24 hr capsule Take 1 capsule by mouth every morning. 10/18/20 04/23/23  [provider]  CLEARLAX 17 GM/SCOOP powder Take 17 g by mouth daily. 01/05/21   [provider]  enoxaparin (LOVENOX) 40 MG/0.4ML injection Inject into the skin. 01/04/21 04/23/23  [provider]  gabapentin (NEURONTIN) 300 MG capsule Take 1 capsule by mouth 3 (three) times daily. 01/05/21   [provider]  ibuprofen (ADVIL) 600 MG tablet Take 1 tablet (600 mg total) by mouth every 6 (six) hours as needed. 11/16/20   Domenick Gong, MD  methocarbamol (ROBAXIN) 500 MG tablet Take 1 tablet by mouth 3 (three) times daily. 01/05/21   [provider]  ondansetron (ZOFRAN) 4 MG tablet Take 1 tablet (4 mg  total) by mouth every 6 (six) hours as needed for nausea. 11/19/20   Dione Booze, MD  oxyCODONE (ROXICODONE) 5 MG immediate release tablet Take 1 tablet (5 mg total) by mouth every 4 (four) hours as needed for severe pain. 01/09/21   Rodriguez-Southworth, Nettie Elm, PA-C  Vitamin D, Ergocalciferol, (DRISDOL) 1.25 MG (50000 UNIT) CAPS capsule Take 1 capsule by mouth once a week. 01/05/21   [provider]    Family History Family History  Problem Relation Age of Onset   Healthy Mother    Other Father        unknown medical history    Social History Social History   Tobacco Use   Smoking status: Former   Smokeless tobacco: Never  Advertising account planner   Vaping status: Some Days   Substances: Nicotine, Flavoring  Substance Use Topics   Alcohol use: No    Comment: social   Drug use: No     Allergies   Patient has no known allergies.   Review of Systems Review of Systems :negative unless otherwise stated in HPI.      Physical Exam Triage Vital Signs ED Triage Vitals [09/17/23 0832]  Encounter Vitals Group     BP 130/88     Systolic BP Percentile      Diastolic BP Percentile      Pulse Rate (!) 57     Resp  16     Temp 98 F (36.7 C)     Temp Source Oral     SpO2 100 %     Weight      Height      Head Circumference      Peak Flow      Pain Score 5     Pain Loc      Pain Education      Exclude from Growth Chart    No data found.  Updated Vital Signs BP 130/88 (BP Location: Left Arm)   Pulse (!) 57   Temp 98 F (36.7 C) (Oral)   Resp 16   SpO2 100%   Visual Acuity Right Eye Distance:   Left Eye Distance:   Bilateral Distance:    Right Eye Near:   Left Eye Near:    Bilateral Near:     Physical Exam  GEN: uncomfortable appearing male, in no acute distress  RESP: no increased work of breathing ABD: Bowel sounds present. Soft, RLQ tenderness, non-distended. No guarding,no rebound,no appreciable hepatosplenomegaly, + right CVA tenderness MSK: no extremity  edema SKIN: warm, dry, no rash on visible skin NEURO: alert, moves all extremities appropriately   UC Treatments / Results  Labs (all labs ordered are listed, but only abnormal results are displayed) Labs Reviewed  URINALYSIS, W/ REFLEX TO CULTURE (INFECTION SUSPECTED) - Abnormal; Notable for the following components:      Result Value   Hgb urine dipstick MODERATE (*)    Protein, ur 30 (*)    All other components within normal limits    EKG  If EKG performed, see my interpretation and MDM section  Radiology No results found.   Procedures Procedures (including critical care time)  Medications Ordered in UC Medications  ketorolac (TORADOL) 30 MG/ML injection 30 mg (30 mg Intramuscular Given 09/17/23 0912)    Initial Impression / Assessment and Plan / UC Course  I have reviewed the triage vital signs and the nursing notes.  Pertinent labs & imaging results that were available during my care of the patient were reviewed by me and considered in my medical decision making (see chart for details).       Patient is a  23 y.o. male who presents after having insidious severe RLQ abdominal pain about a day ago.  Overall, patient is well-appearing, well-hydrated, and in no acute distress.  Vital signs stable.  Benis afebrile.  He has RLQ and right CVA tenderness on exam. Obtained a urinalysis that showed hematuria, proteinuria with calcium oxalate crystals concerning for acute kidney stone. Given Toradol 30 mg IM for pain.  Recommended ED evaluation for his RLQ abdominal tenderness with hematuria and calcium oxalate cystrals for suspect kidney stone. Urgent care workup truncated. Pt will travel via private vehicle to [facility] ED for further evaluation.      Discussed MDM, treatment plan and plan for follow-up with patient who agrees with plan.    Final Clinical Impressions(s) / UC Diagnoses   Final diagnoses:  Other microscopic hematuria  Calcium oxalate crystals in  urine  Right lower quadrant abdominal pain     Discharge Instructions      I am concerned that you have a kidney stone.  You have been advised to follow up immediately in the emergency department for concerning signs or symptoms as discussed during your visit. If you declined EMS transport, please have a family member take you directly to the ED at this time. Do not delay.  Based on concerns about condition, if you do not follow up in the ED, you may risk poor outcomes including worsening of condition, delayed treatment and potentially life threatening issues. If you have declined to go to the ED at this time, you should call your PCP immediately to set up a follow up appointment.     ED Prescriptions   None    PDMP not reviewed this encounter.   Katha Cabal, DO 09/17/23 [phone]

## 2023-09-17 NOTE — ED Notes (Signed)
 Patient is being discharged from the Urgent Care and sent to the Emergency Department via POV . Per [facility], patient is in need of higher level of care due to Kidney stones. Patient is aware and verbalizes understanding of plan of care.  Vitals:   09/17/23 0832  BP: 130/88  Pulse: (!) 57  Resp: 16  Temp: 98 F (36.7 C)  SpO2: 100%

## 2023-09-17 NOTE — ED Triage Notes (Addendum)
 Patient states that he woke up yesterday with abdominal pain, went away and came back worse this morning. Emesis this morning. Burning with urination. No concern for STD. Normal BM last night.

## 2023-09-17 NOTE — Discharge Instructions (Signed)
 I am concerned that you have a kidney stone.  You have been advised to follow up immediately in the emergency department for concerning signs or symptoms as discussed during your visit. If you declined EMS transport, please have a family member take you directly to the ED at this time. Do not delay.   Based on concerns about condition, if you do not follow up in the ED, you may risk poor outcomes including worsening of condition, delayed treatment and potentially life threatening issues. If you have declined to go to the ED at this time, you should call your PCP immediately to set up a follow up appointment.
